# Patient Record
Sex: Male | Born: 1995 | Race: Black or African American | Hispanic: No | Marital: Single | Smoking: Never smoker
Health system: Southern US, Community
[De-identification: ages and names within clinical notes are randomized; demographics above are authoritative.]

---
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
## 2017-01-19 ENCOUNTER — Encounter (INDEPENDENT_AMBULATORY_CARE_PROVIDER_SITE_OTHER): Payer: Self-pay | Admitting: Physician Assistant

## 2017-01-19 ENCOUNTER — Other Ambulatory Visit: Payer: Self-pay

## 2017-01-19 ENCOUNTER — Ambulatory Visit (INDEPENDENT_AMBULATORY_CARE_PROVIDER_SITE_OTHER): Payer: Medicaid Other | Admitting: Physician Assistant

## 2017-01-19 VITALS — BP 130/92 | HR 65 | Temp 97.8°F | Wt 130.8 lb

## 2017-01-19 DIAGNOSIS — D573 Sickle-cell trait: Secondary | ICD-10-CM

## 2017-01-19 DIAGNOSIS — A048 Other specified bacterial intestinal infections: Secondary | ICD-10-CM | POA: Diagnosis not present

## 2017-01-19 DIAGNOSIS — K219 Gastro-esophageal reflux disease without esophagitis: Secondary | ICD-10-CM | POA: Diagnosis not present

## 2017-01-19 MED ORDER — CLARITHROMYCIN 500 MG PO TABS: 500.0000 mg | ORAL_TABLET | Freq: Two times a day (BID) | ORAL | 0 refills | Status: AC

## 2017-01-19 MED ORDER — OMEPRAZOLE 40 MG PO CPDR: 40.0000 mg | DELAYED_RELEASE_CAPSULE | Freq: Every day | ORAL | 0 refills | Status: DC

## 2017-01-19 MED ORDER — AMOXICILLIN 500 MG PO TABS: 1000.0000 mg | ORAL_TABLET | Freq: Two times a day (BID) | ORAL | 0 refills | Status: AC

## 2017-01-19 NOTE — Progress Notes (Signed)
Subjective:  Patient ID: Stephen Bush, male    DOB: 12/20/1995  Age: 22 y.o. MRN: [id number]  CC: gerd and back pain  HPI  Stephen Bush is a 22 y.o. male with a medical history of sickle cell trait, palpitations, chest pain, and anxiety state presents to f/u on chest pain. Says omeprazole has been helping with chest pain. H pylori IgG testing is positive. Abx sent to pharmacy but he did not know which pharmacy and therefore did not begin abx for H pylori infection. No other complaints or symptoms.          Outpatient Medications Prior to Visit  Medication Sig Dispense Refill  . clonazePAM (KLONOPIN) 0.5 MG tablet Take 1 tablet (0.5 mg total) at bedtime by mouth. 21 tablet 0  . escitalopram (LEXAPRO) 10 MG tablet Take 1 tablet (10 mg total) by mouth daily. 90 tablet 1  . omeprazole (PRILOSEC) 40 MG capsule Take 1 capsule (40 mg total) by mouth daily. (Patient not taking: Reported on 01/19/2017) 30 capsule 3   No facility-administered medications prior to visit.      ROS Review of Systems  Constitutional: Negative for chills, fever and malaise/fatigue.  Eyes: Negative for blurred vision.  Respiratory: Negative for shortness of breath.   Cardiovascular: Negative for chest pain and palpitations.  Gastrointestinal: Positive for abdominal pain. Negative for nausea.  Genitourinary: Negative for dysuria and hematuria.  Musculoskeletal: Negative for joint pain and myalgias.  Skin: Negative for rash.  Neurological: Negative for tingling and headaches.  Psychiatric/Behavioral: Negative for depression. The patient is not nervous/anxious.     Objective:  BP (!) 130/92 (BP Location: Left Arm, Patient Position: Sitting, Cuff Size: Normal)   Pulse 65   Temp 97.8 F (36.6 C) (Oral)   Wt 130 lb 12.8 oz (59.3 kg)   SpO2 96%   BMI 20.49 kg/m   BP/Weight 01/19/2017 12/22/2016 11/10/2016  Systolic BP 130 127 111  Diastolic BP 92 80 71  Wt. (Lbs) 130.8 134.2 134.2  BMI 20.49 21.02  21.02      Physical Exam  Constitutional: He is oriented to person, place, and time.  Well developed, thin, NAD, polite  HENT:  Head: Normocephalic and atraumatic.  Eyes: No scleral icterus.  Neck: Normal range of motion. Neck supple. No thyromegaly present.  Cardiovascular: Normal rate, regular rhythm and normal heart sounds.  No murmur heard. Pulmonary/Chest: Effort normal. No respiratory distress. He has no wheezes.  Abdominal: Soft. Bowel sounds are normal. There is tenderness (mild epigastric TTP).  Musculoskeletal: He exhibits no edema.  Neurological: He is alert and oriented to person, place, and time. No cranial nerve deficit. Coordination normal.  Skin: Skin is warm and dry. No rash noted. No erythema. No pallor.  Psychiatric: He has a normal mood and affect. His behavior is normal. Thought content normal.  Vitals reviewed.    Assessment & Plan:   1. Gastroesophageal reflux disease, esophagitis presence not specified - omeprazole (PRILOSEC) 40 MG capsule; Take 1 capsule (40 mg total) by mouth daily.  Dispense: 28 capsule; Refill: 0  2. H. pylori infection - clarithromycin (BIAXIN) 500 MG tablet; Take 1 tablet (500 mg total) by mouth 2 (two) times daily for 14 days.  Dispense: 28 tablet; Refill: 0 - amoxicillin (AMOXIL) 500 MG tablet; Take 2 tablets (1,000 mg total) by mouth 2 (two) times daily for 14 days.  Dispense: 56 tablet; Refill: 0 - omeprazole (PRILOSEC) 40 MG capsule; Take 1 capsule (40 mg total) by mouth  daily.  Dispense: 28 capsule; Refill: 0 - Return in 4 weeks for H pylori breath test.   3. Sickle Cell Trait - Patient showed me a letter from a [facility] and explained patient has sickle cell trait. I explained the significance of sickle cell trait to patient and the possible impact it may  Meds ordered this encounter  Medications  . clarithromycin (BIAXIN) 500 MG tablet    Sig: Take 1 tablet (500 mg total) by mouth 2 (two) times daily for  14 days.    Dispense:  28 tablet    Refill:  0    Order Specific Question:   Supervising Provider    Answer:   Quentin AngstJEGEDE, OLUGBEMIGA E [id number]]  . amoxicillin (AMOXIL) 500 MG tablet    Sig: Take 2 tablets (1,000 mg total) by mouth 2 (two) times daily for 14 days.    Dispense:  56 tablet    Refill:  0    Order Specific Question:   Supervising Provider    Answer:   Quentin AngstJEGEDE, OLUGBEMIGA E [id number]]  . omeprazole (PRILOSEC) 40 MG capsule    Sig: Take 1 capsule (40 mg total) by mouth daily.    Dispense:  28 capsule    Refill:  0    Order Specific Question:   Supervising Provider    Answer:   Quentin AngstJEGEDE, OLUGBEMIGA E [[id number]]    Follow-up: Return in about 4 weeks (around 02/16/2017) for H pylori breath test.   Loletta Specteroger David Erionna Strum PA

## 2017-01-19 NOTE — Patient Instructions (Signed)
Food Choices for Gastroesophageal Reflux Disease, Adult When you have gastroesophageal reflux disease (GERD), the foods you eat and your eating habits are very important. Choosing the right foods can help ease your discomfort. What guidelines do I need to follow?  Choose fruits, vegetables, whole grains, and low-fat dairy products.  Choose low-fat meat, fish, and poultry.  Limit fats such as oils, salad dressings, butter, nuts, and avocado.  Keep a food diary. This helps you identify foods that cause symptoms.  Avoid foods that cause symptoms. These may be different for everyone.  Eat small meals often instead of 3 large meals a day.  Eat your meals slowly, in a place where you are relaxed.  Limit fried foods.  Cook foods using methods other than frying.  Avoid drinking alcohol.  Avoid drinking large amounts of liquids with your meals.  Avoid bending over or lying down until 2-3 hours after eating. What foods are not recommended? These are some foods and drinks that may make your symptoms worse: Vegetables  Tomatoes. Tomato juice. Tomato and spaghetti sauce. Chili peppers. Onion and garlic. Horseradish. Fruits  Oranges, grapefruit, and lemon (fruit and juice). Meats  High-fat meats, fish, and poultry. This includes hot dogs, ribs, ham, sausage, salami, and bacon. Dairy  Whole milk and chocolate milk. Sour cream. Cream. Butter. Ice cream. Cream cheese. Drinks  Coffee and tea. Bubbly (carbonated) drinks or energy drinks. Condiments  Hot sauce. Barbecue sauce. Sweets/Desserts  Chocolate and cocoa. Donuts. Peppermint and spearmint. Fats and Oils  High-fat foods. This includes French fries and potato chips. Other  Vinegar. Strong spices. This includes black pepper, white pepper, red pepper, cayenne, curry powder, cloves, ginger, and chili powder. The items listed above may not be a complete list of foods and drinks to avoid. Contact your dietitian for more information.    This information is not intended to replace advice given to you by your health care provider. Make sure you discuss any questions you have with your health care provider. Document Released: 06/21/2011 Document Revised: 05/28/2015 Document Reviewed: 10/24/2012 Elsevier Interactive Patient Education  2017 Elsevier Inc.  

## 2017-01-23 MED FILL — OMEPRAZOLE DR 40 MG CAPSULE: 40 | 28 days supply | Qty: 28 | Fill #0

## 2017-01-23 MED FILL — AMOXICILLIN 500 MG CAPSULE: 500 | 14 days supply | Qty: 56 | Fill #0

## 2017-01-23 MED FILL — ?CLARITHROMYCIN 500 MG TAB: 500 | 14 days supply | Qty: 28 | Fill #0

## 2017-02-21 ENCOUNTER — Encounter (HOSPITAL_COMMUNITY): Payer: Self-pay

## 2017-02-21 ENCOUNTER — Ambulatory Visit (HOSPITAL_COMMUNITY)
Admission: EM | Admit: 2017-02-21 | Discharge: 2017-02-21 | Disposition: A | Discharge status: Home / Self Care | Payer: Medicaid Other | Attending: Internal Medicine | Admitting: Internal Medicine

## 2017-02-21 DIAGNOSIS — R69 Illness, unspecified: Secondary | ICD-10-CM | POA: Diagnosis not present

## 2017-02-21 DIAGNOSIS — J111 Influenza due to unidentified influenza virus with other respiratory manifestations: Secondary | ICD-10-CM

## 2017-02-21 MED ORDER — NAPROXEN 500 MG PO TABS: 500.0000 mg | ORAL_TABLET | Freq: Two times a day (BID) | ORAL | 0 refills | Status: AC

## 2017-02-21 MED ORDER — PREDNISONE 50 MG PO TABS: 50.0000 mg | ORAL_TABLET | Freq: Every day | ORAL | 0 refills | Status: DC

## 2017-02-21 MED ORDER — BENZONATATE 200 MG PO CAPS: 200.0000 mg | ORAL_CAPSULE | Freq: Three times a day (TID) | ORAL | 1 refills | Status: DC | PRN

## 2017-02-21 MED FILL — NAPROXEN 500 MG TABLET: 500 | 15 days supply | Qty: 30 | Fill #0

## 2017-02-21 MED FILL — predniSONE 10 MG TABS: 10 | 3 days supply | Qty: 15 | Fill #0

## 2017-02-21 MED FILL — BENZONATATE 100 MG CAPSULE: 100 | 7 days supply | Qty: 30 | Fill #0

## 2017-02-21 NOTE — Discharge Instructions (Addendum)
Anticipate gradual improvement over the next several days, in achiness, cough, well being.  Cough may take a couple weeks to subside.  Prescriptions for naproxen (anti inflammatory, pain reliever), prednisone (for congestion, chest tightness), and benzonatate (for cough) were sent to the pharmacy.  Push fluids and rest.  Recheck for new fever >100.5, increasing phlegm production/nasal discharge, or if not starting to improve in a few days.  Note for school/work, return to work on 2/21.

## 2017-02-21 NOTE — ED Triage Notes (Signed)
Pt presents with complaints of chills, cough, and body aches x 5 days.

## 2017-02-21 NOTE — ED Provider Notes (Signed)
[facility]    CSN: [id number] Arrival date & time: 02/21/17  1025     History   Chief Complaint Chief Complaint  Patient presents with  . Influenza    HPI Stephen Bush is a 22 y.o. male. he presents today with 5d hx of achiness (neck, upper back, central chest), cough, headache.  Some sore throat.  No GI symptoms.  Little bit of runny nose.  Malaise.    HPI  History reviewed. No pertinent past medical history.   No past surgical history on file.     Home Medications    Prior to Admission medications   Medication Sig Start Date End Date Taking? Authorizing Provider  benzonatate (TESSALON) 200 MG capsule Take 1 capsule (200 mg total) by mouth 3 (three) times daily as needed for cough. 02/21/17   Isa RankinMurray, Tryce Surratt Wilson, MD  clonazePAM (KLONOPIN) 0.5 MG tablet Take 1 tablet (0.5 mg total) at bedtime by mouth. 11/10/16   Loletta SpecterGomez, Roger David, PA-C  escitalopram (LEXAPRO) 10 MG tablet Take 1 tablet (10 mg total) by mouth daily. 12/22/16   Loletta SpecterGomez, Roger David, PA-C  naproxen (NAPROSYN) 500 MG tablet Take 1 tablet (500 mg total) by mouth 2 (two) times daily. 02/21/17   Isa RankinMurray, Elania Crowl Wilson, MD  omeprazole (PRILOSEC) 40 MG capsule Take 1 capsule (40 mg total) by mouth daily. 01/19/17   Loletta SpecterGomez, Roger David, PA-C  predniSONE (DELTASONE) 50 MG tablet Take 1 tablet (50 mg total) by mouth daily. 02/21/17   Isa RankinMurray, Jenaveve Fenstermaker Wilson, MD    Family History No family history on file.  Social History Social History   Tobacco Use  . Smoking status: Never Smoker  . Smokeless tobacco: Never Used  Substance Use Topics  . Alcohol use: Not on file  . Drug use: Not on file     Allergies   Patient has no known allergies.   Review of Systems Review of Systems  All other systems reviewed and are negative.    Physical Exam Triage Vital Signs ED Triage Vitals [02/21/17 1131]  Enc Vitals Group     BP 114/78     Pulse Rate (!) 58     Resp 16     Temp 98.3 F (36.8 C)   Temp src      SpO2 100 %     Weight      Height      Pain Score      Pain Loc    Updated Vital Signs BP 114/78   Pulse (!) 58   Temp 98.3 F (36.8 C)   Resp 16   SpO2 100%   Physical Exam  Constitutional: He is oriented to person, place, and time.  Alert, nicely groomed Looks ill but not toxic  HENT:  Head: Atraumatic.  Bilateral TMs are quite dull, no erythema Marked left nasal congestion, moderate on the right Throat is injected with clear postnasal drainage  Eyes:  Conjugate gaze, bilateral conjunctivae are slightly injected, no drainage  Neck: Neck supple.  Cardiovascular: Normal rate and regular rhythm.  Pulmonary/Chest: No respiratory distress. He has no wheezes. He has no rales.  Lungs clear, symmetric breath sounds   Abdominal: He exhibits no distension.  Musculoskeletal: Normal range of motion.  Neurological: He is alert and oriented to person, place, and time.  Skin: Skin is warm and dry.  No cyanosis  Nursing note and vitals reviewed.    UC Treatments / Results   Procedures Procedures (including critical care time) None  today  Final Clinical Impressions(s) / UC Diagnoses   Final diagnoses:  Influenza-like illness   Anticipate gradual improvement over the next several days, in achiness, cough, well being.  Cough may take a couple weeks to subside.  Prescriptions for naproxen (anti inflammatory, pain reliever), prednisone (for congestion, chest tightness), and benzonatate (for cough) were sent to the pharmacy.  Push fluids and rest.  Recheck for new fever >100.5, increasing phlegm production/nasal discharge, or if not starting to improve in a few days.  Note for school/work, return to work on 2/21.   ED Discharge Orders        Ordered    naproxen (NAPROSYN) 500 MG tablet  2 times daily     02/21/17 1205    predniSONE (DELTASONE) 50 MG tablet  Daily     02/21/17 1205    benzonatate (TESSALON) 200 MG capsule  3 times daily PRN     02/21/17 1205           Isa Rankin, MD 02/23/17 [phone]

## 2017-03-02 ENCOUNTER — Encounter (INDEPENDENT_AMBULATORY_CARE_PROVIDER_SITE_OTHER): Payer: Self-pay | Admitting: Physician Assistant

## 2017-03-02 ENCOUNTER — Ambulatory Visit (HOSPITAL_COMMUNITY)
Admission: RE | Admit: 2017-03-02 | Discharge: 2017-03-02 | Disposition: A | Discharge status: Home / Self Care | Payer: Medicaid Other | Source: Ambulatory Visit | Attending: Physician Assistant | Admitting: Physician Assistant

## 2017-03-02 ENCOUNTER — Ambulatory Visit (INDEPENDENT_AMBULATORY_CARE_PROVIDER_SITE_OTHER): Payer: Medicaid Other | Admitting: Physician Assistant

## 2017-03-02 VITALS — BP 108/62 | HR 9 | Temp 97.9°F | Resp 18 | Ht 67.32 in | Wt 131.4 lb

## 2017-03-02 DIAGNOSIS — A048 Other specified bacterial intestinal infections: Secondary | ICD-10-CM | POA: Diagnosis not present

## 2017-03-02 DIAGNOSIS — R05 Cough: Secondary | ICD-10-CM | POA: Diagnosis not present

## 2017-03-02 DIAGNOSIS — R059 Cough, unspecified: Secondary | ICD-10-CM

## 2017-03-02 MED ORDER — BENZONATATE 200 MG PO CAPS: 200.0000 mg | ORAL_CAPSULE | Freq: Three times a day (TID) | ORAL | 1 refills | Status: AC | PRN

## 2017-03-02 MED ORDER — LEVOFLOXACIN 750 MG PO TABS: 750.0000 mg | ORAL_TABLET | Freq: Every day | ORAL | 0 refills | Status: AC

## 2017-03-02 MED FILL — BENZONATATE 100 MG CAP: 100 | 30 days supply | Qty: 60 | Fill #0

## 2017-03-02 MED FILL — levoFLOXacin 750 MG TABS: 750 | 5 days supply | Qty: 5 | Fill #0

## 2017-03-02 NOTE — Patient Instructions (Addendum)
Please go to the Radiology Department for your Chest X ray. Do not go to any other department. Ask the hospital help desk personnel to help you find the Radiology department.    Cough, Adult A cough helps to clear your throat and lungs. A cough may last only 2-3 weeks (acute), or it may last longer than 8 weeks (chronic). Many different things can cause a cough. A cough may be a sign of an illness or another medical condition. Follow these instructions at home:  Pay attention to any changes in your cough.  Take medicines only as told by your doctor. ? If you were prescribed an antibiotic medicine, take it as told by your doctor. Do not stop taking it even if you start to feel better. ? Talk with your doctor before you try using a cough medicine.  Drink enough fluid to keep your pee (urine) clear or pale yellow.  If the air is dry, use a cold steam vaporizer or humidifier in your home.  Stay away from things that make you cough at work or at home.  If your cough is worse at night, try using extra pillows to raise your head up higher while you sleep.  Do not smoke, and try not to be around smoke. If you need help quitting, ask your doctor.  Do not have caffeine.  Do not drink alcohol.  Rest as needed. Contact a doctor if:  You have new problems (symptoms).  You cough up yellow fluid (pus).  Your cough does not get better after 2-3 weeks, or your cough gets worse.  Medicine does not help your cough and you are not sleeping well.  You have pain that gets worse or pain that is not helped with medicine.  You have a fever.  You are losing weight and you do not know why.  You have night sweats. Get help right away if:  You cough up blood.  You have trouble breathing.  Your heartbeat is very fast. This information is not intended to replace advice given to you by your health care provider. Make sure you discuss any questions you have with your health care  provider. Document Released: 09/02/2010 Document Revised: 05/28/2015 Document Reviewed: 02/26/2014 Elsevier Interactive Patient Education  [employer]2018 Elsevier Inc.

## 2017-03-02 NOTE — Progress Notes (Signed)
Subjective:  Patient ID: Stephen Bush, male    DOB: 1995-08-02  Age: 22 y.o. MRN: [id number]  CC: f/u   HPI Stephen Bush a 22 y.o.malewith a medical history of sickle cell trait, palpitations, chest pain, and anxiety state presents to f/u on H pylori infection. He finished his antibiotic treatment and PPI treatment approximately two weeks ago. In the interim, he had to go to the ED for 5 day hx of body aches, malaise, sore throat, mild nasal congestion,and cough. Diagnosed with influenza-like illness. No CXR done. Discharged on Naproxen, Prednisone, and Benzonatate. Patient feels he is worse now. Cough frequency is occasion and stable but he has been "coughing blood". Has been 9 days since his ED visit. Does not endorse any other symptoms or complaints.       Outpatient Medications Prior to Visit  Medication Sig Dispense Refill  . benzonatate (TESSALON) 200 MG capsule Take 1 capsule (200 mg total) by mouth 3 (three) times daily as needed for cough. 30 capsule 1  . escitalopram (LEXAPRO) 10 MG tablet Take 1 tablet (10 mg total) by mouth daily. 90 tablet 1  . naproxen (NAPROSYN) 500 MG tablet Take 1 tablet (500 mg total) by mouth 2 (two) times daily. 30 tablet 0  . clonazePAM (KLONOPIN) 0.5 MG tablet Take 1 tablet (0.5 mg total) at bedtime by mouth. (Patient not taking: Reported on 03/02/2017) 21 tablet 0  . omeprazole (PRILOSEC) 40 MG capsule Take 1 capsule (40 mg total) by mouth daily. 28 capsule 0  . predniSONE (DELTASONE) 50 MG tablet Take 1 tablet (50 mg total) by mouth daily. 3 tablet 0   No facility-administered medications prior to visit.      ROS Review of Systems  Constitutional: Positive for malaise/fatigue. Negative for chills and fever.  HENT: Positive for sore throat.   Eyes: Negative for blurred vision.  Respiratory: Positive for cough. Negative for shortness of breath.   Cardiovascular: Positive for chest pain. Negative for palpitations.  Gastrointestinal:  Negative for abdominal pain and nausea.  Genitourinary: Negative for dysuria and hematuria.  Musculoskeletal: Negative for joint pain and myalgias.  Skin: Negative for rash.  Neurological: Negative for tingling and headaches.  Psychiatric/Behavioral: Negative for depression. The patient is not nervous/anxious.     Objective:  BP 108/62 (BP Location: Left Arm, Patient Position: Sitting, Cuff Size: Normal)   Pulse (!) 9   Temp 97.9 F (36.6 C) (Oral)   Resp 18   Ht 5' 7.32" (1.71 m)   Wt 131 lb 6.4 oz (59.6 kg)   SpO2 100%   BMI 20.38 kg/m   BP/Weight 03/02/2017 02/21/2017 01/19/2017  Systolic BP 108 114 130  Diastolic BP 62 78 92  Wt. (Lbs) 131.4 - 130.8  BMI 20.38 - 20.49      Physical Exam  Constitutional: He is oriented to person, place, and time.  Well developed, well nourished, NAD, polite  HENT:  Head: Normocephalic and atraumatic.  Eyes: No scleral icterus.  Neck: Normal range of motion. Neck supple. No thyromegaly present.  Cardiovascular: Normal rate, regular rhythm and normal heart sounds.  Pulmonary/Chest: Effort normal and breath sounds normal.  Abdominal: Soft. Bowel sounds are normal. There is no tenderness.  Musculoskeletal: He exhibits no edema.  Neurological: He is alert and oriented to person, place, and time.  Skin: Skin is warm and dry. No rash noted. No erythema. No pallor.  Psychiatric: He has a normal mood and affect. His behavior is normal. Thought content normal.  Vitals reviewed.    Assessment & Plan:    1. H. pylori infection - H. pylori breath test conducted today. Will wait on result. 2. Cough - Refill benzonatate (TESSALON) 200 MG capsule; Take 1 capsule (200 mg total) by mouth 3 (three) times daily as needed for cough.  Dispense: 30 capsule; Refill: 1 - Begin levofloxacin (LEVAQUIN) 750 MG tablet; Take 1 tablet (750 mg total) by mouth daily for 5 days.  Dispense: 5 tablet; Refill: 0 - DG Chest 2 View; Future   Meds ordered this  encounter  Medications  . benzonatate (TESSALON) 200 MG capsule    Sig: Take 1 capsule (200 mg total) by mouth 3 (three) times daily as needed for cough.    Dispense:  30 capsule    Refill:  1    Order Specific Question:   Supervising Provider    Answer:   Quentin AngstJEGEDE, OLUGBEMIGA E [id number]  . levofloxacin (LEVAQUIN) 750 MG tablet    Sig: Take 1 tablet (750 mg total) by mouth daily for 5 days.    Dispense:  5 tablet    Refill:  0    Order Specific Question:   Supervising Provider    Answer:   Quentin AngstJEGEDE, OLUGBEMIGA E [[id number]]    Follow-up: Return in about 4 days (around 03/06/2017).   Loletta Specteroger David Gomez PA

## 2017-03-03 ENCOUNTER — Telehealth (INDEPENDENT_AMBULATORY_CARE_PROVIDER_SITE_OTHER): Payer: Self-pay | Admitting: *Deleted

## 2017-03-03 NOTE — Telephone Encounter (Signed)
Medical Assistant left message on patient's home and cell voicemail. Voicemail states to give [place] call back [facility] [place] [facility] at [phone]. Patient is aware of chest x ray being normal

## 2017-03-03 NOTE — Telephone Encounter (Signed)
-----   Message from Loletta Specteroger David Gomez, PA-C sent at 03/02/2017  6:23 PM EST ----- Normal chest xray.

## 2017-03-05 LAB — H. PYLORI BREATH TEST: H PYLORI BREATH TEST: POSITIVE — AB

## 2017-03-06 ENCOUNTER — Encounter (INDEPENDENT_AMBULATORY_CARE_PROVIDER_SITE_OTHER): Payer: Self-pay | Admitting: Physician Assistant

## 2017-03-06 ENCOUNTER — Other Ambulatory Visit: Payer: Self-pay

## 2017-03-06 ENCOUNTER — Ambulatory Visit (INDEPENDENT_AMBULATORY_CARE_PROVIDER_SITE_OTHER): Payer: Medicaid Other | Admitting: Physician Assistant

## 2017-03-06 VITALS — BP 112/69 | HR 68 | Temp 97.4°F | Wt 130.2 lb

## 2017-03-06 DIAGNOSIS — M546 Pain in thoracic spine: Secondary | ICD-10-CM | POA: Diagnosis not present

## 2017-03-06 DIAGNOSIS — R059 Cough, unspecified: Secondary | ICD-10-CM

## 2017-03-06 DIAGNOSIS — R05 Cough: Secondary | ICD-10-CM | POA: Diagnosis not present

## 2017-03-06 DIAGNOSIS — A048 Other specified bacterial intestinal infections: Secondary | ICD-10-CM

## 2017-03-06 DIAGNOSIS — T148XXA Other injury of unspecified body region, initial encounter: Secondary | ICD-10-CM | POA: Diagnosis not present

## 2017-03-06 MED ORDER — METRONIDAZOLE 250 MG PO TABS: 250.0000 mg | ORAL_TABLET | Freq: Four times a day (QID) | ORAL | 0 refills | Status: AC

## 2017-03-06 MED ORDER — OMEPRAZOLE 40 MG PO CPDR: 40.0000 mg | DELAYED_RELEASE_CAPSULE | Freq: Two times a day (BID) | ORAL | 0 refills | Status: AC

## 2017-03-06 MED ORDER — TETRACYCLINE HCL 500 MG PO CAPS: 500.0000 mg | ORAL_CAPSULE | Freq: Four times a day (QID) | ORAL | 0 refills | Status: AC

## 2017-03-06 MED ORDER — CYCLOBENZAPRINE HCL 10 MG PO TABS: 10.0000 mg | ORAL_TABLET | Freq: Three times a day (TID) | ORAL | 0 refills | Status: AC | PRN

## 2017-03-06 MED ORDER — BISMUTH SUBSALICYLATE 262 MG PO TABS: 1.0000 | ORAL_TABLET | Freq: Four times a day (QID) | ORAL | 0 refills | Status: AC

## 2017-03-06 MED FILL — TETRACYCLINE 500 MG CAPSULE: 500 | 14 days supply | Qty: 56 | Fill #0

## 2017-03-06 MED FILL — CYCLOBENZAPRINE 10 MG TAB: 10 | 3 days supply | Qty: 10 | Fill #0

## 2017-03-06 MED FILL — OMEPRAZOLE DR 40 MG CAPSULE: 40 | 14 days supply | Qty: 28 | Fill #0

## 2017-03-06 MED FILL — metroNIDAZOLE 250 MG TABS: 250 | 14 days supply | Qty: 56 | Fill #0

## 2017-03-06 NOTE — Patient Instructions (Signed)
Helicobacter Pylori Infection  Helicobacter pylori infection is an infection in the stomach that is caused by the Helicobacter pylori (H. pylori) bacteria. This type of bacteria often lives in the lining of the stomach. The infection can cause ulcers and irritation (gastritis) in some people. It is the most common cause of ulcers in the stomach (gastric ulcer) and in the upper part of the intestine (duodenal ulcer). Having this infection may also increase the risk of stomach cancer and a type of white blood cell cancer (lymphoma) that affects the stomach.  What are the causes?  H. pylori is a type of bacteria that is often found in the stomachs of healthy people. The bacteria may be passed from person to person through contact with stool or saliva. It is not known why some people develop ulcers, gastritis, or cancer from the infection.  What increases the risk?  This condition is more likely to develop in people who:  · Have family members with the infection.  · Live with many other people, such as in a dormitory.  · Are of African, Hispanic, or Asian descent.     What are the signs or symptoms?  Most people with this infection do not have symptoms. If you do have symptoms, they may include:  · Heartburn.  · Stomach pain.  · Nausea.  · Vomiting.  · Blood-tinged vomit.  · Loss of appetite.  · Bad breath.     How is this diagnosed?  This condition may be diagnosed based on your symptoms, a physical exam, and various tests. Tests may include:  · Blood tests or stool tests to check for the proteins (antibodies) that your body may produce in response to the bacteria. These tests are the best way to confirm the diagnosis.  · A breath test to check for the type of gas that the H. pylori bacteria release after breaking down a substance called urea. For the test, you are asked to drink urea. This test is often done after treatment in order to find out if the treatment worked.  · A procedure in which a thin, flexible tube  with a tiny camera at the end is placed into your stomach and upper intestine (upper endoscopy). Your health care provider may also take tissue samples (biopsy) to test for H. pylori and cancer.     How is this treated?  Treatment for this condition usually involves taking a combination of medicines (triple therapy) for a couple of weeks. Triple therapy includes one medicine to reduce the acid in your stomach and two types of antibiotic medicines. Many drug combinations have been approved for treatment. Treatment usually kills the H. pylori and reduces your risk of cancer. You may need to be tested for H. pylori again after treatment. In some cases, the treatment may need to be repeated.  Follow these instructions at home:  ·   · Take over-the-counter and prescription medicines only as told by your health care provider.  · Take your antibiotics as told by your health care provider. Do not stop taking the antibiotics even if you start to feel better.  · You can do all your usual activities and eat what you usually do.  · Take steps to prevent future infections:  ? Wash your hands often.  ? Make sure the food you eat has been properly prepared.  ? Drink water only from clean sources.  · Keep all follow-up visits as told by your health care provider. This is important.  Contact   a health care provider if:  · Your symptoms do not get better.  · Your symptoms return after treatment.  This information is not intended to replace advice given to you by your health care provider. Make sure you discuss any questions you have with your health care provider.  Document Released: 04/13/2015 Document Revised: 05/28/2015 Document Reviewed: 01/01/2014  Elsevier Interactive Patient Education © 2018 Elsevier Inc.   

## 2017-03-06 NOTE — Progress Notes (Signed)
Subjective:  Patient ID: Stephen Bush, male    DOB: 12/20/1995  Age: 22 y.o. MRN: [id number]  CC: f/u    HPI Stephen KoyanagiJafari Kasongois a 21 y.o.malewith a medical history ofsickle cell trait,palpitations, chest pain, and anxiety state presents to f/u on cough with possible hemoptysis. Had CXR ordered which revealed no abnormality. Also taking Levaquin which he says has been helpful and is now feeling well. H pylori breath test conducted at the last visit  resulted in a positive finding. Patient has already taken initial triple therapy with clarithromycin, amoxicillin, and omeprazole. Abdominal pain is much better but still persistent. Alleviated with PPI. Does not endorse any other associated symptoms or complaints.     Outpatient Medications Prior to Visit  Medication Sig Dispense Refill  . benzonatate (TESSALON) 200 MG capsule Take 1 capsule (200 mg total) by mouth 3 (three) times daily as needed for cough. 30 capsule 1  . escitalopram (LEXAPRO) 10 MG tablet Take 1 tablet (10 mg total) by mouth daily. 90 tablet 1  . levofloxacin (LEVAQUIN) 750 MG tablet Take 1 tablet (750 mg total) by mouth daily for 5 days. 5 tablet 0  . naproxen (NAPROSYN) 500 MG tablet Take 1 tablet (500 mg total) by mouth 2 (two) times daily. 30 tablet 0   No facility-administered medications prior to visit.      ROS Review of Systems  Constitutional: Negative for chills, fever and malaise/fatigue.  Eyes: Negative for blurred vision.  Respiratory: Negative for shortness of breath.   Cardiovascular: Negative for chest pain and palpitations.  Gastrointestinal: Negative for abdominal pain and nausea.  Genitourinary: Negative for dysuria and hematuria.  Musculoskeletal: Negative for joint pain and myalgias.  Skin: Negative for rash.  Neurological: Negative for tingling and headaches.  Psychiatric/Behavioral: Negative for depression. The patient is not nervous/anxious.     Objective:  BP 112/69 (BP Location: Left  Arm, Patient Position: Sitting, Cuff Size: Normal)   Pulse 68   Temp (!) 97.4 F (36.3 C) (Oral)   Wt 130 lb 3.2 oz (59.1 kg)   SpO2 100%   BMI 20.20 kg/m   BP/Weight 03/06/2017 03/02/2017 02/21/2017  Systolic BP 112 108 114  Diastolic BP 69 62 78  Wt. (Lbs) 130.2 131.4 -  BMI 20.2 20.38 -      Physical Exam  Constitutional: He is oriented to person, place, and time.  Well developed, well nourished, NAD, polite  HENT:  Head: Normocephalic and atraumatic.  Eyes: No scleral icterus.  Neck: Normal range of motion. Neck supple. No thyromegaly present.  Cardiovascular: Normal rate, regular rhythm and normal heart sounds.  Pulmonary/Chest: Effort normal and breath sounds normal.  Abdominal: Soft. Bowel sounds are normal. There is no tenderness.  Musculoskeletal: He exhibits no edema.  Neurological: He is alert and oriented to person, place, and time.  Skin: Skin is warm and dry. No rash noted. No erythema. No pallor.  Psychiatric: He has a normal mood and affect. His behavior is normal. Thought content normal.  Vitals reviewed.    Assessment & Plan:    1. H. pylori infection - omeprazole (PRILOSEC) 40 MG capsule; Take 1 capsule (40 mg total) by mouth 2 (two) times daily for 14 days.  Dispense: 28 capsule; Refill: 0 - tetracycline (ACHROMYCIN,SUMYCIN) 500 MG capsule; Take 1 capsule (500 mg total) by mouth 4 (four) times daily.  Dispense: 56 capsule; Refill: 0 - metroNIDAZOLE (FLAGYL) 250 MG tablet; Take 1 tablet (250 mg total) by mouth 4 (four) times  daily.  Dispense: 56 tablet; Refill: 0 - Bismuth Subsalicylate 262 MG TABS; Take 1 tablet (262 mg total) by mouth 4 (four) times daily.  Dispense: 56 each; Refill: 0  2. Cough - Resolved with Levaquin.   3. Muscle strain - Begin cyclobenzaprine (FLEXERIL) 10 MG tablet; Take 1 tablet (10 mg total) by mouth 3 (three) times daily as needed for muscle spasms.  Dispense: 10 tablet; Refill: 0  4. Acute midline thoracic back pain -  cyclobenzaprine (FLEXERIL) 10 MG tablet; Take 1 tablet (10 mg total) by mouth 3 (three) times daily as needed for muscle spasms.  Dispense: 10 tablet; Refill: 0   Meds ordered this encounter  Medications  . omeprazole (PRILOSEC) 40 MG capsule    Sig: Take 1 capsule (40 mg total) by mouth 2 (two) times daily for 14 days.    Dispense:  28 capsule    Refill:  0    Order Specific Question:   Supervising Provider    Answer:   Quentin Angst [id number]  . tetracycline (ACHROMYCIN,SUMYCIN) 500 MG capsule    Sig: Take 1 capsule (500 mg total) by mouth 4 (four) times daily.    Dispense:  56 capsule    Refill:  0    Order Specific Question:   Supervising Provider    Answer:   Quentin Angst [id number]  . metroNIDAZOLE (FLAGYL) 250 MG tablet    Sig: Take 1 tablet (250 mg total) by mouth 4 (four) times daily.    Dispense:  56 tablet    Refill:  0    Order Specific Question:   Supervising Provider    Answer:   Quentin Angst [id number]  . Bismuth Subsalicylate 262 MG TABS    Sig: Take 1 tablet (262 mg total) by mouth 4 (four) times daily.    Dispense:  56 each    Refill:  0    Order Specific Question:   Supervising Provider    Answer:   Quentin Angst [id number]  . cyclobenzaprine (FLEXERIL) 10 MG tablet    Sig: Take 1 tablet (10 mg total) by mouth 3 (three) times daily as needed for muscle spasms.    Dispense:  10 tablet    Refill:  0    Order Specific Question:   Supervising Provider    Answer:   Quentin Angst [[id number]]    Follow-up: Return in about 4 weeks (around 04/03/2017) for H pylori breath test..   Loletta Specter PA

## 2017-03-10 ENCOUNTER — Telehealth: Payer: Self-pay | Admitting: Licensed Clinical Social Worker

## 2017-03-10 NOTE — Telephone Encounter (Signed)
LCSWA attempted to contact pt. LCSWA left message for a return call.

## 2017-03-14 ENCOUNTER — Telehealth: Payer: Self-pay | Admitting: Licensed Clinical Social Worker

## 2017-03-14 NOTE — Telephone Encounter (Signed)
LCSWA attempted to contact pt. LCSWA left message for a return call.  

## 2017-03-31 ENCOUNTER — Other Ambulatory Visit (INDEPENDENT_AMBULATORY_CARE_PROVIDER_SITE_OTHER): Payer: Medicaid Other

## 2017-04-03 ENCOUNTER — Other Ambulatory Visit (INDEPENDENT_AMBULATORY_CARE_PROVIDER_SITE_OTHER): Payer: Medicaid Other

## 2017-04-03 DIAGNOSIS — A048 Other specified bacterial intestinal infections: Secondary | ICD-10-CM

## 2017-04-05 ENCOUNTER — Telehealth (INDEPENDENT_AMBULATORY_CARE_PROVIDER_SITE_OTHER): Payer: Self-pay

## 2017-04-05 LAB — H. PYLORI BREATH TEST: H pylori Breath Test: NEGATIVE

## 2017-04-05 NOTE — Telephone Encounter (Signed)
-----   Message from Loletta Specteroger David Gomez, PA-C sent at 04/05/2017  9:28 AM EDT ----- H pylori breath test negative.

## 2017-04-05 NOTE — Telephone Encounter (Signed)
Patient aware of negative H pylori. Edwin Cherian S Kamaljit Hizer, CMA  

## 2018-11-16 IMAGING — DX DG CHEST 2V
2 series · 2 of 2 positions shown · non-contrast
Comparison: 10/19/2016

CLINICAL DATA: Cough with possible hemoptysis

EXAM:
CHEST  2 VIEW

[w chest pa]
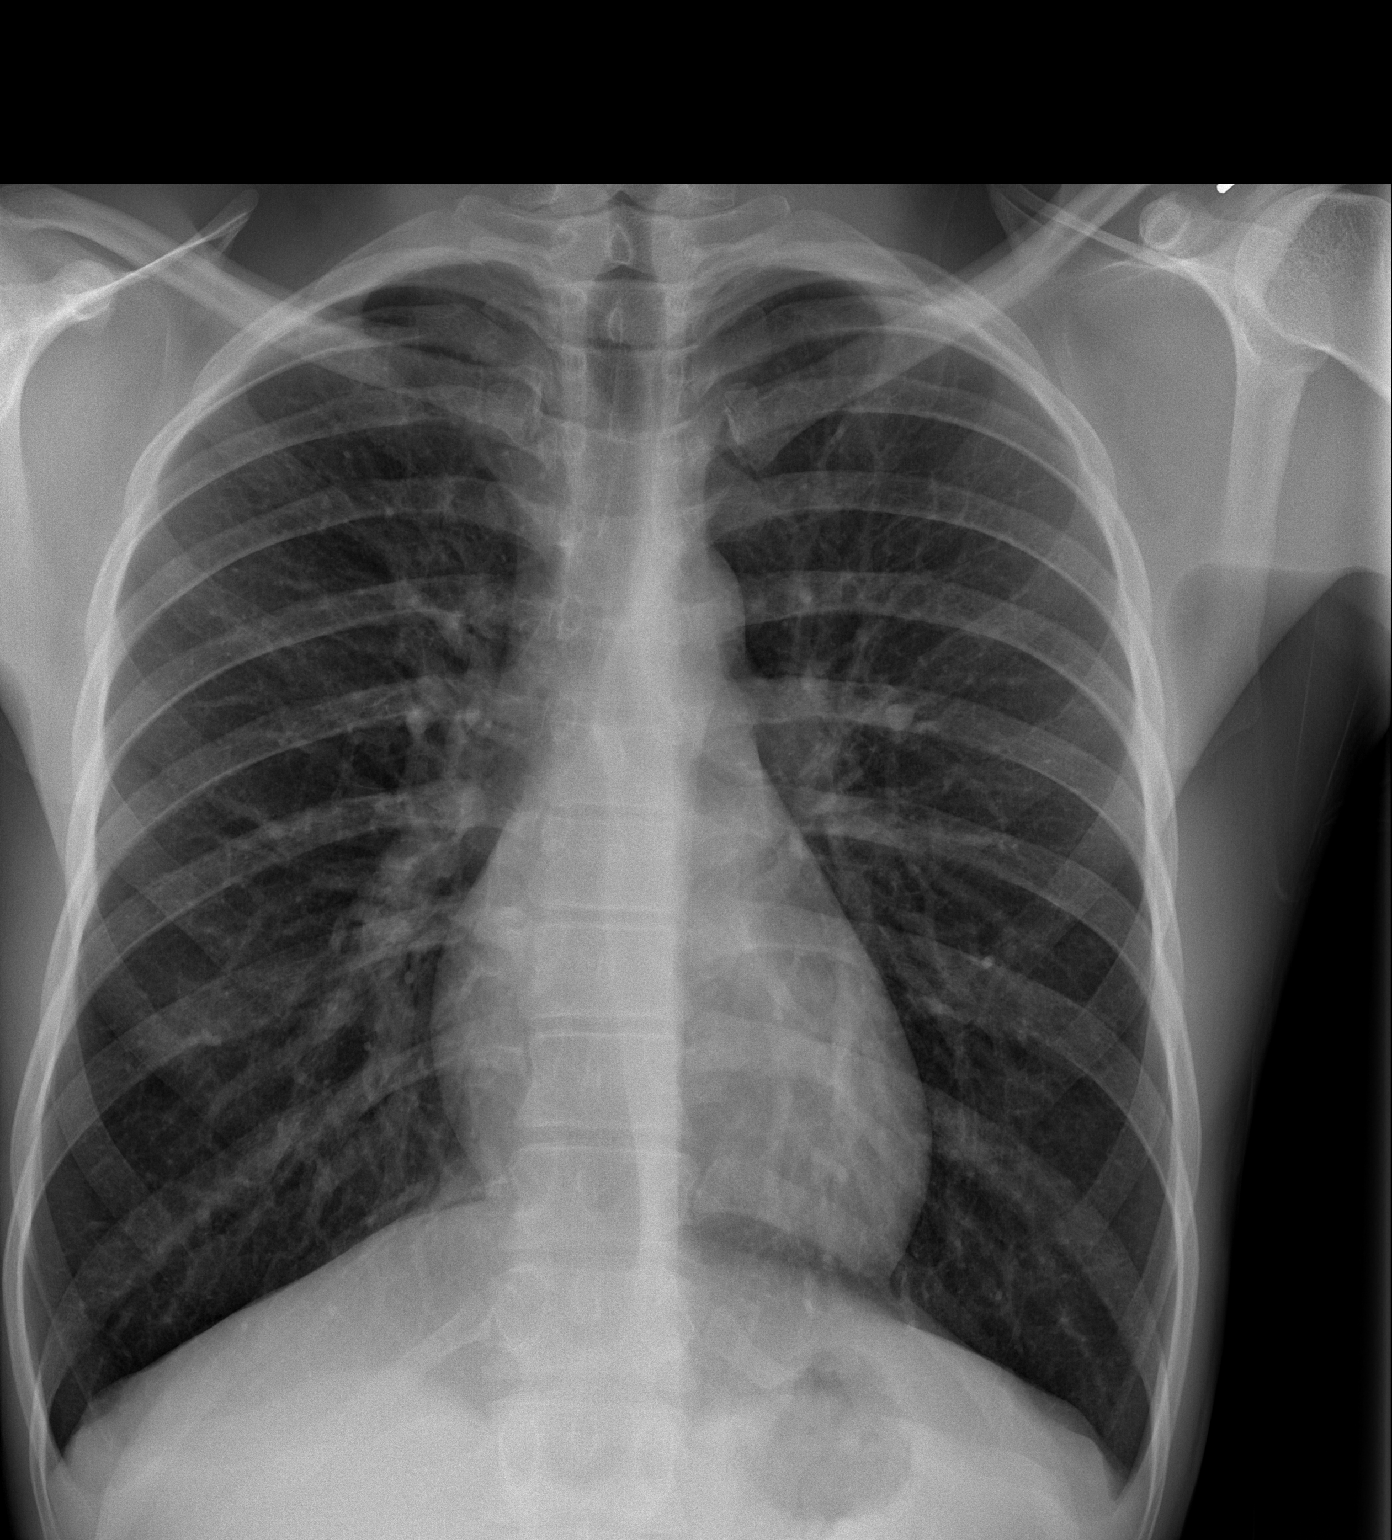

[w chest lat]
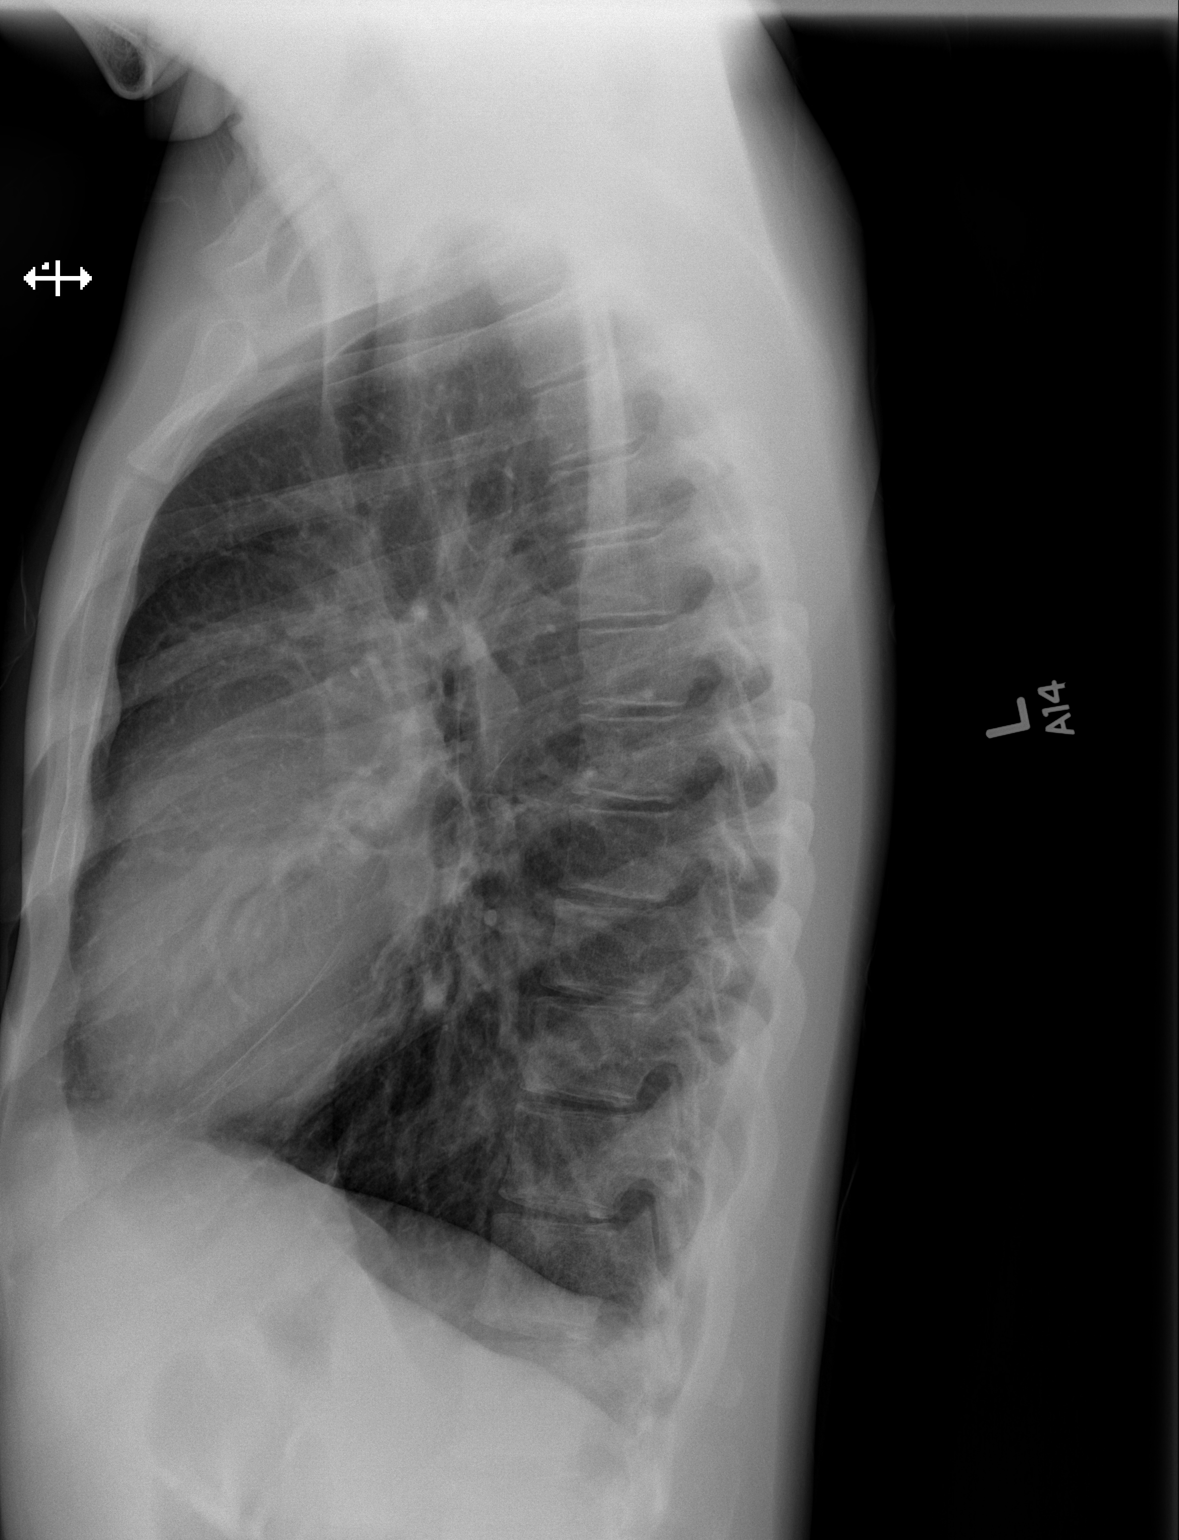

[2 of 2 positions shown; findings below may reference images not displayed]

FINDINGS: The heart size and mediastinal contours are within normal limits.
Both lungs are clear. The visualized skeletal structures are
unremarkable.
IMPRESSION: No active cardiopulmonary disease.

## 2021-04-10 ENCOUNTER — Other Ambulatory Visit: Payer: Self-pay

## 2021-04-10 ENCOUNTER — Emergency Department (HOSPITAL_COMMUNITY)
Admission: EM | Admit: 2021-04-10 | Discharge: 2021-04-10 | Disposition: A | Discharge status: Home / Self Care | Payer: Medicaid Other | Attending: Emergency Medicine | Admitting: Emergency Medicine

## 2021-04-10 ENCOUNTER — Encounter (HOSPITAL_COMMUNITY): Payer: Self-pay | Admitting: Emergency Medicine

## 2021-04-10 DIAGNOSIS — H6121 Impacted cerumen, right ear: Secondary | ICD-10-CM | POA: Insufficient documentation

## 2021-04-10 DIAGNOSIS — H9201 Otalgia, right ear: Secondary | ICD-10-CM | POA: Diagnosis present

## 2021-04-10 MED ORDER — DOCUSATE SODIUM 50 MG/5ML PO LIQD: 100.0000 mg | Freq: Once | ORAL | Status: DC

## 2021-04-10 MED ORDER — DOCUSATE SODIUM 50 MG/5ML PO LIQD: 50.0000 mg | Freq: Once | ORAL | Status: DC

## 2021-04-10 NOTE — ED Triage Notes (Signed)
Pt c/o right ear pain that started today while asleep. ?

## 2021-04-11 NOTE — ED Provider Notes (Signed)
?[facility] EMERGENCY DEPARTMENT ?Provider Note ? ? ?CSN: [id number] ?Arrival date & time: 04/10/21  4008 ? ?  ? ?History ? ?Chief Complaint  ?Patient presents with  ? Otalgia  ? ? ?Stephen Bush is a 26 y.o. male. ? ?Patient describes feeling of something moving in his ear when he woke up.  He thought there might be a bug or something in there.  His hearing is fine.  Has had a really severe pain.  He presents here.  At this time he does not feel anything moving. ? ? ?Otalgia ? ?  ? ?Home Medications ?Prior to Admission medications   ?Medication Sig Start Date End Date Taking? Authorizing Provider  ?benzonatate (TESSALON) 200 MG capsule Take 1 capsule (200 mg total) by mouth 3 (three) times daily as needed for cough. 03/02/17   Loletta Specter, PA-C  ?Bismuth Subsalicylate 262 MG TABS Take 1 tablet (262 mg total) by mouth 4 (four) times daily. 03/06/17   Loletta Specter, PA-C  ?cyclobenzaprine (FLEXERIL) 10 MG tablet Take 1 tablet (10 mg total) by mouth 3 (three) times daily as needed for muscle spasms. 03/06/17   Loletta Specter, PA-C  ?escitalopram (LEXAPRO) 10 MG tablet Take 1 tablet (10 mg total) by mouth daily. 12/22/16   Loletta Specter, PA-C  ?metroNIDAZOLE (FLAGYL) 250 MG tablet Take 1 tablet (250 mg total) by mouth 4 (four) times daily. 03/06/17   Loletta Specter, PA-C  ?naproxen (NAPROSYN) 500 MG tablet Take 1 tablet (500 mg total) by mouth 2 (two) times daily. 02/21/17   Isa Rankin, MD  ?omeprazole (PRILOSEC) 40 MG capsule Take 1 capsule (40 mg total) by mouth 2 (two) times daily for 14 days. 03/06/17 03/20/17  Loletta Specter, PA-C  ?tetracycline (ACHROMYCIN,SUMYCIN) 500 MG capsule Take 1 capsule (500 mg total) by mouth 4 (four) times daily. 03/06/17   Loletta Specter, PA-C  ?   ? ?Allergies    ?Patient has no known allergies.   ? ?Review of Systems   ?Review of Systems  ?HENT:  Positive for ear pain.   ? ?Physical Exam ?Updated Vital Signs ?BP 117/78 (BP Location: Left  Arm)   Pulse 60   Temp (!) 97.4 ?F (36.3 ?C)   Resp 18   Ht 5\' 7"  (1.702 m)   Wt 59.1 kg   SpO2 100%   BMI 20.41 kg/m?  ?Physical Exam ?Vitals and nursing note reviewed.  ?Constitutional:   ?   Appearance: He is well-developed.  ?HENT:  ?   Head: Normocephalic and atraumatic.  ?   Right Ear: Hearing and external ear normal. No drainage or swelling. No middle ear effusion. There is no impacted cerumen.  ?   Left Ear: Hearing and external ear normal. No drainage or swelling.  No middle ear effusion. There is no impacted cerumen.  ?   Ears:  ?   Comments: Right ear with more cerumen than left, no obvious foreign body.  ? ?Most cerumen removed by nursing, still no foreign body. Still some adhered cerumen to TM, patient not tolerating further removal.  ?   Mouth/Throat:  ?   Mouth: Mucous membranes are moist.  ?   Pharynx: Oropharynx is clear.  ?Eyes:  ?   Pupils: Pupils are equal, round, and reactive to light.  ?Cardiovascular:  ?   Rate and Rhythm: Normal rate.  ?Pulmonary:  ?   Effort: Pulmonary effort is normal. No respiratory distress.  ?Abdominal:  ?  General: Abdomen is flat. There is no distension.  ?Musculoskeletal:     ?   General: Normal range of motion.  ?   Cervical back: Normal range of motion.  ?Skin: ?   General: Skin is warm and dry.  ?Neurological:  ?   General: No focal deficit present.  ?   Mental Status: He is alert.  ? ? ?ED Results / Procedures / Treatments   ?Labs ?(all labs ordered are listed, but only abnormal results are displayed) ?Labs Reviewed - No data to display ? ?EKG ?None ? ?Radiology ?No results found. ? ?Procedures ?Procedures  ? ? ?Medications Ordered in ED ?Medications - No data to display ? ?ED Course/ Medical Decision Making/ A&P ?  ?                        ?Medical Decision Making ?No obvious foreign bodies.  Patient is moving too much even after lidocaine was used.  I am afraid that if I try to clean out the cerumen completely that I will puncture his tympanic membrane.   Will refer to ENT.  I do not suspect foreign body however I cannot fully visualize the whole ear canal secondary to the cerumen and that could be something back there. I do not see a insect of any sort or infection. No e/o dental abnormalities. ? ?Final Clinical Impression(s) / ED Diagnoses ?Final diagnoses:  ?Cerumen debris on tympanic membrane of right ear  ? ? ?Rx / DC Orders ?ED Discharge Orders   ? ? None  ? ?  ? ? ?  ?Marily Memos, MD ?04/11/21 8453 ? ?

## 2021-04-12 ENCOUNTER — Telehealth: Payer: Self-pay

## 2021-04-12 NOTE — Telephone Encounter (Signed)
Transition Care Management Unsuccessful Follow-up Telephone Call ? ?Date of discharge and from where:  04/10/2021-[place]  ? ?Attempts:  1st Attempt ? ?Reason for unsuccessful TCM follow-up call:  Unable to reach patient ? ?  ?

## 2021-04-13 NOTE — Telephone Encounter (Signed)
Transition Care Management Unsuccessful Follow-up Telephone Call ? ?Date of discharge and from where:  04/10/2021-[place] ? ?Attempts:  2nd Attempt ? ?Reason for unsuccessful TCM follow-up call:  Unable to reach patient ? ?  ?

## 2021-04-14 NOTE — Telephone Encounter (Signed)
Transition Care Management Unsuccessful Follow-up Telephone Call ? ?Date of discharge and from where:  04/10/2021-[place]  ? ?Attempts:  3rd Attempt ? ?Reason for unsuccessful TCM follow-up call:  Unable to reach patient ? ?  ?

## 2023-11-27 ENCOUNTER — Ambulatory Visit (INDEPENDENT_AMBULATORY_CARE_PROVIDER_SITE_OTHER): Admitting: Primary Care
